# Patient Record
Sex: Female | Born: 1980 | Race: White | Hispanic: No | Smoking: Former smoker
Health system: Southern US, Community
[De-identification: ages and names within clinical notes are randomized; demographics above are authoritative.]

## PROBLEM LIST (undated history)

## (undated) DIAGNOSIS — F329 Major depressive disorder, single episode, unspecified: Secondary | ICD-10-CM

## (undated) DIAGNOSIS — F419 Anxiety disorder, unspecified: Secondary | ICD-10-CM

## (undated) DIAGNOSIS — G43909 Migraine, unspecified, not intractable, without status migrainosus: Secondary | ICD-10-CM

## (undated) DIAGNOSIS — F32A Depression, unspecified: Secondary | ICD-10-CM

## (undated) DIAGNOSIS — E119 Type 2 diabetes mellitus without complications: Secondary | ICD-10-CM

## (undated) DIAGNOSIS — E786 Lipoprotein deficiency: Secondary | ICD-10-CM

## (undated) HISTORY — PX: EYE SURGERY: SHX253

---
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
## 2019-06-07 ENCOUNTER — Emergency Department (HOSPITAL_COMMUNITY)
Admission: EM | Admit: 2019-06-07 | Discharge: 2019-06-07 | Disposition: A | Discharge status: Home / Self Care | Payer: BLUE CROSS/BLUE SHIELD | Attending: Emergency Medicine | Admitting: Emergency Medicine

## 2019-06-07 ENCOUNTER — Encounter (HOSPITAL_COMMUNITY): Payer: Self-pay | Admitting: Emergency Medicine

## 2019-06-07 ENCOUNTER — Other Ambulatory Visit: Payer: Self-pay

## 2019-06-07 DIAGNOSIS — R438 Other disturbances of smell and taste: Secondary | ICD-10-CM | POA: Insufficient documentation

## 2019-06-07 DIAGNOSIS — R1084 Generalized abdominal pain: Secondary | ICD-10-CM | POA: Insufficient documentation

## 2019-06-07 DIAGNOSIS — Z87891 Personal history of nicotine dependence: Secondary | ICD-10-CM | POA: Insufficient documentation

## 2019-06-07 DIAGNOSIS — E119 Type 2 diabetes mellitus without complications: Secondary | ICD-10-CM | POA: Diagnosis not present

## 2019-06-07 DIAGNOSIS — Z20822 Contact with and (suspected) exposure to covid-19: Secondary | ICD-10-CM

## 2019-06-07 DIAGNOSIS — R51 Headache: Secondary | ICD-10-CM | POA: Diagnosis present

## 2019-06-07 DIAGNOSIS — Z20828 Contact with and (suspected) exposure to other viral communicable diseases: Secondary | ICD-10-CM | POA: Diagnosis not present

## 2019-06-07 DIAGNOSIS — M7918 Myalgia, other site: Secondary | ICD-10-CM | POA: Diagnosis not present

## 2019-06-07 DIAGNOSIS — G43909 Migraine, unspecified, not intractable, without status migrainosus: Secondary | ICD-10-CM | POA: Insufficient documentation

## 2019-06-07 HISTORY — DX: Migraine, unspecified, not intractable, without status migrainosus: G43.909

## 2019-06-07 HISTORY — DX: Type 2 diabetes mellitus without complications: E11.9

## 2019-06-07 HISTORY — DX: Anxiety disorder, unspecified: F41.9

## 2019-06-07 HISTORY — DX: Lipoprotein deficiency: E78.6

## 2019-06-07 HISTORY — DX: Depression, unspecified: F32.A

## 2019-06-07 LAB — CBC
HCT: 48.6 % — ABNORMAL HIGH (ref 36.0–46.0)
Hemoglobin: 16.4 g/dL — ABNORMAL HIGH (ref 12.0–15.0)
MCH: 30 pg (ref 26.0–34.0)
MCHC: 33.7 g/dL (ref 30.0–36.0)
MCV: 89 fL (ref 80.0–100.0)
Platelets: 251 10*3/uL (ref 150–400)
RBC: 5.46 MIL/uL — ABNORMAL HIGH (ref 3.87–5.11)
RDW: 13.2 % (ref 11.5–15.5)
WBC: 6 10*3/uL (ref 4.0–10.5)
nRBC: 0 % (ref 0.0–0.2)

## 2019-06-07 LAB — URINALYSIS, ROUTINE W REFLEX MICROSCOPIC
Bilirubin Urine: NEGATIVE
Glucose, UA: >=500 mg/dL — AB
Hgb urine dipstick: NEGATIVE
Ketones, ur: 80 mg/dL — AB
Leukocytes,Ua: NEGATIVE
Nitrite: NEGATIVE
Protein, ur: 100 mg/dL — AB
Specific Gravity, Urine: 1.024 (ref 1.005–1.030)
pH: 6 (ref 5.0–8.0)

## 2019-06-07 LAB — COMPREHENSIVE METABOLIC PANEL
ALT: 22 U/L (ref 0–44)
AST: 23 U/L (ref 15–41)
Albumin: 3.4 g/dL — ABNORMAL LOW (ref 3.5–5.0)
Alkaline Phosphatase: 63 U/L (ref 38–126)
Anion gap: 15 (ref 5–15)
BUN: 13 mg/dL (ref 6–20)
CO2: 16 mmol/L — ABNORMAL LOW (ref 22–32)
Calcium: 9.2 mg/dL (ref 8.9–10.3)
Chloride: 101 mmol/L (ref 98–111)
Creatinine, Ser: 0.73 mg/dL (ref 0.44–1.00)
GFR calc Af Amer: >60 mL/min (ref >60)
GFR calc non Af Amer: >60 mL/min (ref >60)
Glucose, Bld: 272 mg/dL — ABNORMAL HIGH (ref 70–99)
Potassium: 3.4 mmol/L — ABNORMAL LOW (ref 3.5–5.1)
Sodium: 132 mmol/L — ABNORMAL LOW (ref 135–145)
Total Bilirubin: 0.6 mg/dL (ref 0.3–1.2)
Total Protein: 7.5 g/dL (ref 6.5–8.1)

## 2019-06-07 LAB — LIPASE, BLOOD: Lipase: 30 U/L (ref 11–51)

## 2019-06-07 LAB — I-STAT BETA HCG BLOOD, ED (MC, WL, AP ONLY): I-stat hCG, quantitative: <5 m[IU]/mL (ref <5)

## 2019-06-07 MED ORDER — METOCLOPRAMIDE HCL 5 MG/ML IJ SOLN
10.0000 mg | Freq: Once | INTRAMUSCULAR | Status: AC
  Administered 2019-06-07: 18:00:00 10 mg via INTRAVENOUS
  Filled 2019-06-07: qty 2

## 2019-06-07 MED ORDER — KETOROLAC TROMETHAMINE 30 MG/ML IJ SOLN
30.0000 mg | Freq: Once | INTRAMUSCULAR | Status: AC
  Administered 2019-06-07: 30 mg via INTRAVENOUS
  Filled 2019-06-07: qty 1

## 2019-06-07 MED ORDER — SODIUM CHLORIDE 0.9 % IV BOLUS
1000.0000 mL | Freq: Once | INTRAVENOUS | Status: AC
  Administered 2019-06-07: 1000 mL via INTRAVENOUS

## 2019-06-07 MED ORDER — SODIUM CHLORIDE 0.9% FLUSH
3.0000 mL | Freq: Once | INTRAVENOUS | Status: AC
  Administered 2019-06-07: 18:00:00 3 mL via INTRAVENOUS

## 2019-06-07 MED ORDER — DIPHENHYDRAMINE HCL 50 MG/ML IJ SOLN
25.0000 mg | Freq: Once | INTRAMUSCULAR | Status: AC
  Administered 2019-06-07: 25 mg via INTRAVENOUS
  Filled 2019-06-07: qty 1

## 2019-06-07 NOTE — Discharge Instructions (Signed)
As we discussed, you have a cover test pending.  You should self quarantine and isolate until this result comes back.  If it is positive, she continues self quarantine.  Make sure you are getting plenty of fluids and staying hydrated.  You can take Tylenol or Ibuprofen as directed for pain. You can alternate Tylenol and Ibuprofen every 4 hours. If you take Tylenol at 1pm, then you can take Ibuprofen at 5pm. Then you can take Tylenol again at 9pm.   Return the emergency department for any difficulty breathing, inability eat or drink or any other worsening concerning symptoms.     Person Under Monitoring Name: Stephanie Oconnor  Location: [address] [place] [address] [place] [id number]   Infection Prevention Recommendations for Individuals Confirmed to have, or Being Evaluated for, 2019 Novel Coronavirus (COVID-19) Infection Who Receive Care at Home  Individuals who are confirmed to have, or are being evaluated for, COVID-19 should follow the prevention steps below until a healthcare provider or local or state health department says they can return to normal activities.  Stay home except to get medical care You should restrict activities outside your home, except for getting medical care. Do not go to work, school, or public areas, and do not use public transportation or taxis.  Call ahead before visiting your doctor Before your medical appointment, call the healthcare provider and tell them that you have, or are being evaluated for, COVID-19 infection. This will help the healthcare providers office take steps to keep other people from getting infected. Ask your healthcare provider to call the local or state health department.  Monitor your symptoms Seek prompt medical attention if your illness is worsening (e.g., difficulty breathing). Before going to your medical appointment, call the healthcare provider and tell them that you have, or are being evaluated for, COVID-19 infection.  Ask your healthcare provider to call the local or state health department.  Wear a facemask You should wear a facemask that covers your nose and mouth when you are in the same room with other people and when you visit a healthcare provider. People who live with or visit you should also wear a facemask while they are in the same room with you.  Separate yourself from other people in your home As much as possible, you should stay in a different room from other people in your home. Also, you should use a separate bathroom, if available.  Avoid sharing household items You should not share dishes, drinking glasses, cups, eating utensils, towels, bedding, or other items with other people in your home. After using these items, you should wash them thoroughly with soap and water.  Cover your coughs and sneezes Cover your mouth and nose with a tissue when you cough or sneeze, or you can cough or sneeze into your sleeve. Throw used tissues in a lined trash can, and immediately wash your hands with soap and water for at least 20 seconds or use an alcohol-based hand rub.  Wash your [employer]hands Wash your hands often and thoroughly with soap and water for at least 20 seconds. You can use an alcohol-based hand sanitizer if soap and water are not available and if your hands are not visibly dirty. Avoid touching your eyes, nose, and mouth with unwashed hands.   Prevention Steps for Caregivers and Household Members of Individuals Confirmed to have, or Being Evaluated for, COVID-19 Infection Being Cared for in the Home  If you live with, or provide care at home for,  a person confirmed to have, or being evaluated for, COVID-19 infection please follow these guidelines to prevent infection:  Follow healthcare providers instructions Make sure that you understand and can help the patient follow any healthcare provider instructions for all care.  Provide for the patients basic needs You should help the  patient with basic needs in the home and provide support for getting groceries, prescriptions, and other personal needs.  Monitor the patients symptoms If they are getting sicker, call his or her medical provider and tell them that the patient has, or is being evaluated for, COVID-19 infection. This will help the healthcare providers office take steps to keep other people from getting infected. Ask the healthcare provider to call the local or state health department.  Limit the number of people who have contact with the patient  If possible, have only one caregiver for the patient.  Other household members should stay in another home or place of residence. If this is not possible, they should stay  in another room, or be separated from the patient as much as possible. Use a separate bathroom, if available.  Restrict visitors who do not have an essential need to be in the home.  Keep older adults, very young children, and other sick people away from the patient Keep older adults, very young children, and those who have compromised immune systems or chronic health conditions away from the patient. This includes people with chronic heart, lung, or kidney conditions, diabetes, and cancer.  Ensure good ventilation Make sure that shared spaces in the home have good air flow, such as from an air conditioner or an opened window, weather permitting.  Wash your hands often  Wash your hands often and thoroughly with soap and water for at least 20 seconds. You can use an alcohol based hand sanitizer if soap and water are not available and if your hands are not visibly dirty.  Avoid touching your eyes, nose, and mouth with unwashed hands.  Use disposable paper towels to dry your hands. If not available, use dedicated cloth towels and replace them when they become wet.  Wear a facemask and gloves  Wear a disposable facemask at all times in the room and gloves when you touch or have contact  with the patients blood, body fluids, and/or secretions or excretions, such as sweat, saliva, sputum, nasal mucus, vomit, urine, or feces.  Ensure the mask fits over your nose and mouth tightly, and do not touch it during use.  Throw out disposable facemasks and gloves after using them. Do not reuse.  Wash your hands immediately after removing your facemask and gloves.  If your personal clothing becomes contaminated, carefully remove clothing and launder. Wash your hands after handling contaminated clothing.  Place all used disposable facemasks, gloves, and other waste in a lined container before disposing them with other household waste.  Remove gloves and wash your hands immediately after handling these items.  Do not share dishes, glasses, or other household items with the patient  Avoid sharing household items. You should not share dishes, drinking glasses, cups, eating utensils, towels, bedding, or other items with a patient who is confirmed to have, or being evaluated for, COVID-19 infection.  After the person uses these items, you should wash them thoroughly with soap and water.  Wash laundry thoroughly  Immediately remove and wash clothes or bedding that have blood, body fluids, and/or secretions or excretions, such as sweat, saliva, sputum, nasal mucus, vomit, urine, or feces, on them.  Wear gloves when handling laundry from the patient.  Read and follow directions on labels of laundry or clothing items and detergent. In general, wash and dry with the warmest temperatures recommended on the label.  Clean all areas the individual has used often  Clean all touchable surfaces, such as counters, tabletops, doorknobs, bathroom fixtures, toilets, phones, keyboards, tablets, and bedside tables, every day. Also, clean any surfaces that may have blood, body fluids, and/or secretions or excretions on them.  Wear gloves when cleaning surfaces the patient has come in contact with.  Use  a diluted bleach solution (e.g., dilute bleach with 1 part bleach and 10 parts water) or a household disinfectant with a label that says EPA-registered for coronaviruses. To make a bleach solution at home, add 1 tablespoon of bleach to 1 quart (4 cups) of water. For a larger supply, add  cup of bleach to 1 gallon (16 cups) of water.  Read labels of cleaning products and follow recommendations provided on product labels. Labels contain instructions for safe and effective use of the cleaning product including precautions you should take when applying the product, such as wearing gloves or eye protection and making sure you have good ventilation during use of the product.  Remove gloves and wash hands immediately after cleaning.  Monitor yourself for signs and symptoms of illness Caregivers and household members are considered close contacts, should monitor their health, and will be asked to limit movement outside of the home to the extent possible. Follow the monitoring steps for close contacts listed on the symptom monitoring form.   ? If you have additional questions, contact your local health department or call the epidemiologist on call at [phone] (available 24/7). ? This guidance is subject to change. For the most up-to-date guidance from [facility], please refer to their website: TripMetro.huhttps://www.cdc.gov/coronavirus/2019-ncov/hcp/guidance-prevent-spread.html

## 2019-06-07 NOTE — ED Triage Notes (Signed)
Pt with reports of migraine, diarrhea since Saturday night. Using OTC meds and Butolatol without relief. Yesterday she noticed her taste and smell has decreased. A/O x4 States she has been tested x2 for Covid-19 with negative results.

## 2019-06-07 NOTE — ED Notes (Signed)
Urine culture send with sample 

## 2019-06-07 NOTE — ED Provider Notes (Signed)
[facility] Note   CSN: [id number] Arrival date & time: 06/07/19  1343    History   Chief Complaint Chief Complaint  Patient presents with  . Migraine  . Diarrhea  . Loss of Taste and Smell    HPI Stephanie SewerBarbara Oconnor is a 38 y.o. female past medical history of anxiety, depression, diabetes, migraines who presents for evaluation of headache, generalized body aches, generalized abdominal pain and diarrhea.  She reports that her headache initially began about 4 days ago.  At onset of symptoms, is very mild and states that it has progressively worsened.  She does have a history of migraines but states that this headache feels similar in nature to her previous headaches.  She took her home medication (Butolatol) with no improvement in headache.  Denies any preceding trauma, head injury, fall.  He has not had any vision changes, numbness/weakness of her arms or legs, vomiting.  She has had some nausea which is typical of her migraines.  She has not noted any fever.  Patient states that she also started having some generalized body aches about 3 days ago as well some generalized abdominal pain.  She also reports she has had multiple episodes of nonbloody diarrhea over the last 3 days.  She reports decreased appetite.  She reports that yesterday, she felt like she lost her taste and smell.  She has not noted any fevers, cough, chest pain, difficulty breathing, vomiting.  She has not had any recent travel outside the country, travel inside the country, recent antibiotics.  She denies any known, 19 exposure.     The history is provided by the patient.    Past Medical History:  Diagnosis Date  . Anxiety   . Depression   . Diabetes mellitus without complication (HCC)   . Hypocholesteremia   . Migraines     There are no active problems to display for this patient.   Past Surgical History:  Procedure Laterality Date  . EYE SURGERY       OB History    No obstetric history on file.      Home Medications    Prior to Admission medications   Not on File    Family History No family history on file.  Social History Social History   Tobacco Use  . Smoking status: Former Games developermoker  . Smokeless tobacco: Never Used  Substance Use Topics  . Alcohol use: Yes  . Drug use: Yes    Types: Marijuana     Allergies   Codeine   Review of Systems Review of Systems  Constitutional: Positive for appetite change. Negative for fever.  Eyes: Negative for visual disturbance.  Respiratory: Negative for cough and shortness of breath.   Cardiovascular: Negative for chest pain.  Gastrointestinal: Positive for abdominal pain, diarrhea and nausea. Negative for blood in stool and vomiting.  Genitourinary: Negative for dysuria and hematuria.  Musculoskeletal: Positive for myalgias.  Neurological: Positive for headaches. Negative for weakness and numbness.  All other systems reviewed and are negative.    Physical Exam Updated Vital Signs BP 110/85   Pulse 95   Temp 98.4 F (36.9 C) (Oral)   Resp 17   LMP 05/22/2019   SpO2 99%   Physical Exam Vitals signs and nursing note reviewed.  Constitutional:      Appearance: Normal appearance. She is well-developed.  HENT:     Head: Normocephalic and atraumatic.  Eyes:     General: Lids are  normal.     Conjunctiva/sclera: Conjunctivae normal.     Pupils: Pupils are equal, round, and reactive to light.     Comments: PERRL. EOMs intact. No nystagmus. No neglect.   Neck:     Musculoskeletal: Full passive range of motion without pain and neck supple.     Comments: Neck is supple and without rigidity. Cardiovascular:     Rate and Rhythm: Normal rate and regular rhythm.     Pulses: Normal pulses.     Heart sounds: Normal heart sounds. No murmur. No friction rub. No gallop.   Pulmonary:     Effort: Pulmonary effort is normal.     Breath sounds: Normal breath sounds.  Abdominal:      Palpations: Abdomen is soft. Abdomen is not rigid.     Tenderness: There is generalized abdominal tenderness. There is no guarding.     Comments: Abdomen is soft, nondistended.  Diffuse tenderness noted throughout with no focal point.  No rigidity, guarding.  Musculoskeletal: Normal range of motion.  Skin:    General: Skin is warm and dry.     Capillary Refill: Capillary refill takes less than 2 seconds.  Neurological:     Mental Status: She is alert and oriented to person, place, and time.     Comments: Cranial nerves III-XII intact Follows commands, Moves all extremities  5/5 strength to BUE and BLE  Sensation intact throughout all major nerve distributions Normal coordination  No slurred speech. No facial droop.   Psychiatric:        Speech: Speech normal.      ED Treatments / Results  Labs (all labs ordered are listed, but only abnormal results are displayed) Labs Reviewed  COMPREHENSIVE METABOLIC PANEL - Abnormal; Notable for the following components:      Result Value   Sodium 132 (*)    Potassium 3.4 (*)    CO2 16 (*)    Glucose, Bld 272 (*)    Albumin 3.4 (*)    All other components within normal limits  CBC - Abnormal; Notable for the following components:   RBC 5.46 (*)    Hemoglobin 16.4 (*)    HCT 48.6 (*)    All other components within normal limits  URINALYSIS, ROUTINE W REFLEX MICROSCOPIC - Abnormal; Notable for the following components:   Color, Urine AMBER (*)    APPearance CLOUDY (*)    Glucose, UA >=500 (*)    Ketones, ur 80 (*)    Protein, ur 100 (*)    Bacteria, UA MANY (*)    All other components within normal limits  SARS CORONAVIRUS 2  LIPASE, BLOOD  I-STAT BETA HCG BLOOD, ED ([facility], [facility], [facility] ONLY)    EKG None  Radiology No results found.  Procedures Procedures (including critical care time)  Medications Ordered in ED Medications  sodium chloride flush (NS) 0.9 % injection 3 mL (3 mLs Intravenous Given 06/07/19 1753)  sodium chloride  0.9 % bolus 1,000 mL (0 mLs Intravenous Stopped 06/07/19 1927)  metoCLOPramide (REGLAN) injection 10 mg (10 mg Intravenous Given 06/07/19 1755)  diphenhydrAMINE (BENADRYL) injection 25 mg (25 mg Intravenous Given 06/07/19 1754)  ketorolac (TORADOL) 30 MG/ML injection 30 mg (30 mg Intravenous Given 06/07/19 1757)     Initial Impression / Assessment and Plan / ED Course  I have reviewed the triage vital signs and the nursing notes.  Pertinent labs & imaging results that were available during my care of the patient were reviewed by me  and considered in my medical decision making (see chart for details).        38 year old female with past medical 3 of diabetes who presents for evaluation of generalized abdominal pain, diarrhea, decreased appetite, myalgias, headache.  History of migraines and states that headache feels the same.  No fevers, chest pain, difficulty breathing.  No known COVID-19 exposure. Patient is afebrile, non-toxic appearing, sitting comfortably on examination table. Vital signs reviewed and stable.  No neuro deficits on exam.  Neck is supple and without rigidity.  Diffuse tenderness noted to the abdomen without any focal point.  Consider migraine headache.  History/physical exam not concerning for meningitis.  Consider abdominal infectious process versus viral GI process.  Also consider COVID-19.  We will plan for COVID-19 testing, migraine cocktail.  Labs ordered at triage.  I-STAT beta negative.  CMP shows potassium of 3.4.  Glucose is 272.  Urine creatinine within normal limits.  Work-up not concerning for DKA.  Lipase is unremarkable.  CBC unremarkable.  Urine shows small amount of ketones.  Suspect this is most likely from dehydration.  No evidence of infectious etiology.  There is some bacteria noted but suspect this is contaminant from squamous epithelium.  Reevaluation.  Patient reports feeling better.  Headache has improved.  Patient is ready to go home.  I discussed with  patient that COVID results are pending and that she should quarantine and self isolate and see her back.  Encouraged supportive care measures. At this time, patient exhibits no emergent life-threatening condition that require further evaluation in ED or admission. Patient had ample opportunity for questions and discussion. All patient's questions were answered with full understanding. Strict return precautions discussed. Patient expresses understanding and agreement to plan.   Stephanie SewerBarbara Oconnor was evaluated in Emergency Department on 06/07/2019 for the symptoms described in the history of present illness. She was evaluated in the context of the global COVID-19 pandemic, which necessitated consideration that the patient might be at risk for infection with the SARS-CoV-2 virus that causes COVID-19. Institutional protocols and algorithms that pertain to the evaluation of patients at risk for COVID-19 are in a state of rapid change based on information released by regulatory bodies including the CDC and federal and state organizations. These policies and algorithms were followed during the patient's care in the ED.  Portions of this note were generated with Scientist, clinical (histocompatibility and immunogenetics)Dragon dictation software. Dictation errors may occur despite best attempts at proofreading.   Final Clinical Impressions(s) / ED Diagnoses   Final diagnoses:  Migraine without status migrainosus, not intractable, unspecified migraine type  Suspected Covid-19 Virus Infection    ED Discharge Orders    None       Rosana HoesLayden, Kagan Mutchler A, PA-C 06/07/19 2244    Maia PlanLong, Joshua G, MD 06/08/19 0930

## 2019-06-07 NOTE — ED Notes (Signed)
Discharge instructions discussed with pt. Pt verbalized understanding no questions at this time.  

## 2019-06-08 LAB — SARS CORONAVIRUS 2 (TAT 6-24 HRS): SARS Coronavirus 2: NEGATIVE

## 2019-08-22 ENCOUNTER — Emergency Department (HOSPITAL_COMMUNITY)
Admission: EM | Admit: 2019-08-22 | Discharge: 2019-08-23 | Disposition: A | Discharge status: Home / Self Care | Payer: BLUE CROSS/BLUE SHIELD | Attending: Emergency Medicine | Admitting: Emergency Medicine

## 2019-08-22 ENCOUNTER — Other Ambulatory Visit: Payer: Self-pay

## 2019-08-22 ENCOUNTER — Encounter (HOSPITAL_COMMUNITY): Payer: Self-pay | Admitting: Emergency Medicine

## 2019-08-22 DIAGNOSIS — Z87891 Personal history of nicotine dependence: Secondary | ICD-10-CM | POA: Diagnosis not present

## 2019-08-22 DIAGNOSIS — F1210 Cannabis abuse, uncomplicated: Secondary | ICD-10-CM | POA: Diagnosis not present

## 2019-08-22 DIAGNOSIS — K047 Periapical abscess without sinus: Secondary | ICD-10-CM

## 2019-08-22 DIAGNOSIS — E119 Type 2 diabetes mellitus without complications: Secondary | ICD-10-CM | POA: Insufficient documentation

## 2019-08-22 DIAGNOSIS — K0889 Other specified disorders of teeth and supporting structures: Secondary | ICD-10-CM | POA: Diagnosis present

## 2019-08-22 MED ORDER — BUPIVACAINE-EPINEPHRINE (PF) 0.5% -1:200000 IJ SOLN
1.8000 mL | Freq: Once | INTRAMUSCULAR | Status: AC
  Administered 2019-08-23: 1.8 mL
  Filled 2019-08-22: qty 1.8

## 2019-08-22 NOTE — ED Triage Notes (Addendum)
Pt here for eval of right jaw pain/dental pain with swelling to right jaw for several days. It started out as a broken tooth.

## 2019-08-22 NOTE — ED Provider Notes (Signed)
[facility] EMERGENCY DEPARTMENT Provider Note   CSN: [id number] Arrival date & time: 08/22/19  1657     History   Chief Complaint Chief Complaint  Patient presents with  . Dental Pain  . Oral Swelling    HPI Stephanie Oconnor is a 38 y.o. female with a history of diabetes mellitus who presented to the emergency department with a chief complaint of right-sided jaw pain and swelling for the last 2 days.  She reports that she has a broken tooth on the right lower side that has been present for several years.  She reports that when the pain intensifies but will shoot down her neck, but she denies fever, chills, drooling, muffled voice, sore throat, URI symptoms.  She recently moved to the area from [place] and is not established with a dentist.  She has been taking ibuprofen for pain with minimal improvement.  She is a diabetic and reports that her blood sugars have been slightly elevated, around 180s at home, since the onset of her symptoms.  No missed doses of her home medications.     The history is provided by the patient. No language interpreter was used.    Past Medical History:  Diagnosis Date  . Anxiety   . Depression   . Diabetes mellitus without complication (HCC)   . Hypocholesteremia   . Migraines     There are no active problems to display for this patient.   Past Surgical History:  Procedure Laterality Date  . EYE SURGERY       OB History   No obstetric history on file.      Home Medications    Prior to Admission medications   Medication Sig Start Date End Date Taking? Authorizing Provider  amoxicillin (AMOXIL) 500 MG capsule Take 1 capsule (500 mg total) by mouth 3 (three) times daily for 5 days. 08/23/19 08/28/19  Aristide Waggle A, PA-C  HYDROcodone-acetaminophen (HYCET) 7.5-325 mg/15 ml solution Take 10 mLs by mouth every 6 (six) hours as needed for moderate pain. 08/23/19   Viktorya Arguijo A, PA-C    Family History No family history  on file.  Social History Social History   Tobacco Use  . Smoking status: Former Games developer  . Smokeless tobacco: Never Used  Substance Use Topics  . Alcohol use: Yes  . Drug use: Yes    Types: Marijuana     Allergies   Codeine   Review of Systems Review of Systems  Constitutional: Negative for activity change, chills and fever.  HENT: Positive for dental problem and facial swelling. Negative for drooling, ear discharge, ear pain, mouth sores, sore throat, trouble swallowing and voice change.   Respiratory: Negative for shortness of breath and wheezing.   Cardiovascular: Negative for chest pain, palpitations and leg swelling.  Gastrointestinal: Negative for abdominal pain, constipation, diarrhea and nausea.  Genitourinary: Negative for dysuria.  Musculoskeletal: Negative for back pain.  Skin: Negative for rash.  Allergic/Immunologic: Negative for immunocompromised state.  Neurological: Negative for headaches.  Psychiatric/Behavioral: Negative for confusion.    Physical Exam Updated Vital Signs BP (!) 151/100 (BP Location: Right Arm)   Pulse 100   Temp 98.2 F (36.8 C) (Oral)   Resp (!) 24   LMP 08/01/2019   SpO2 100%   Physical Exam Vitals signs and nursing note reviewed.  Constitutional:      General: She is not in acute distress.    Appearance: She is not ill-appearing, toxic-appearing or diaphoretic.  Comments: Tearful  HENT:     Head: Normocephalic.     Mouth/Throat:     Dentition: Dental abscesses present.     Pharynx: Oropharynx is clear. Uvula midline. No pharyngeal swelling, oropharyngeal exudate, posterior oropharyngeal erythema or uvula swelling.     Tonsils: 0 on the right. 0 on the left.      Comments: Right lower first molar is fractured to the level of the gumline on the buccal aspect of the tooth however, the tooth is intact on the lingual aspect.  A small amount of fluctuance is noted to the lower buccal mucosa consistent with periapical abscess.   Minimal purulent drainage was expressed with palpation of the distal aspect of tooth #30. Mild fluctuance is noted along the body of the right mandible.  No sublingual edema.  Uvula is midline with clear posterior oropharynx.  There is no trismus.  No tonsillar swelling or exudates. Eyes:     Conjunctiva/sclera: Conjunctivae normal.  Neck:     Musculoskeletal: Neck supple. No neck rigidity.     Comments: No meningismus Cardiovascular:     Rate and Rhythm: Normal rate and regular rhythm.     Heart sounds: No murmur. No friction rub. No gallop.   Pulmonary:     Effort: Pulmonary effort is normal. No respiratory distress.     Comments: Tolerating secretions without difficulty. Abdominal:     General: There is no distension.     Palpations: Abdomen is soft.  Lymphadenopathy:     Cervical: Cervical adenopathy present.  Skin:    General: Skin is warm.     Findings: No rash.  Neurological:     Mental Status: She is alert.  Psychiatric:        Behavior: Behavior normal.      ED Treatments / Results  Labs (all labs ordered are listed, but only abnormal results are displayed) Labs Reviewed - No data to display  EKG None  Radiology No results found.  Procedures .Marland Kitchen.Incision and Drainage  Date/Time: 08/23/2019 12:21 AM Performed by: Barkley BoardsMcDonald, Myrta Mercer A, PA-C Authorized by: Barkley BoardsMcDonald, Hannan Hutmacher A, PA-C   Consent:    Consent obtained:  Verbal   Consent given by:  Patient   Risks discussed:  Bleeding, incomplete drainage, pain, infection and damage to other organs   Alternatives discussed:  Observation Location:    Type:  Abscess   Location:  Mouth   Mouth location: periapical abscess of tooth #30. Anesthesia (see MAR for exact dosages):    Anesthesia method:  Topical application and local infiltration   Topical anesthetic:  Lidocaine gel   Local anesthetic:  Bupivacaine 0.5% WITH epi Procedure type:    Complexity:  Simple Procedure details:    Needle aspiration: no     Incision  types:  Single straight   Incision depth:  Dermal   Scalpel blade:  10   Drainage:  Bloody   Drainage amount:  Scant   Wound treatment:  Wound left open   Packing materials:  None Post-procedure details:    Patient tolerance of procedure:  Tolerated well, no immediate complications   (including critical care time)  Medications Ordered in ED Medications  bupivacaine-epinephrine (MARCAINE W/ EPI) 0.5% -1:200000 injection 1.8 mL (has no administration in time range)  amoxicillin (AMOXIL) capsule 500 mg (has no administration in time range)     Initial Impression / Assessment and Plan / ED Course  I have reviewed the triage vital signs and the nursing notes.  Pertinent labs &  imaging results that were available during my care of the patient were reviewed by me and considered in my medical decision making (see chart for details).        38 year old female presenting with right-sided facial swelling for the last 2 days.  On exam, tooth #30 is fractured to the level of the gumline on the buccal aspect of the tooth.  Minimal purulent drainage is expressed with palpation of the distal aspect of the tube.  Uvula is midline.  She is tolerating secretions without difficulty.  There is no trismus.  No sublingual edema.  Doubt PTA, retropharyngeal abscess, deep space infection of the neck, or Ludwig's angina.  Shared decision-making conversation.  She would like to proceed with a dental block.  Prior to procedure, we discussed the risks versus benefit of the procedure, including bleeding, incomplete drainage, pain, damage to other organs, or infection.  He agreed to proceed with the procedure.  Inferior alveolar block was performed on the right.  Anesthesia was achieved.  Following the procedure, aside from anesthesia associated with the right inferior alveolar nerve, she was neurovascularly intact with no complaints.  A 0.5 cm incision was made to the buccal gingiva at the base of the tooth #30.  A  scant amount of bloody discharge draining from the area.  I attempted to express additional purulent drainage from the distal aspect of tooth 30 without success.  Given known source of infection and the patient's history of diabetes mellitus, first dose of amoxicillin was given in the ER.  I will discharge her home with salt water rinses, amoxicillin, Tylenol and ibuprofen with Hycet for uncontrollable pain with dental follow-up.  All questions answered.  Following procedure, she reports significant improvement in her pain.  She is hemodynamically stable in no acute distress.  ER return precautions given.  Safe to discharge to home with outpatient follow-up as indicated.  Final Clinical Impressions(s) / ED Diagnoses   Final diagnoses:  Dental abscess    ED Discharge Orders         Ordered    amoxicillin (AMOXIL) 500 MG capsule  3 times daily     08/23/19 0019    HYDROcodone-acetaminophen (HYCET) 7.5-325 mg/15 ml solution  Every 6 hours PRN     08/23/19 0019           Kamdyn Colborn, Maree Erie A, PA-C 08/23/19 Josefa Half, MD 08/23/19 [phone]

## 2019-08-23 MED ORDER — AMOXICILLIN 500 MG PO CAPS
500.0000 mg | ORAL_CAPSULE | Freq: Once | ORAL | Status: AC
  Administered 2019-08-23: 500 mg via ORAL
  Filled 2019-08-23: qty 1

## 2019-08-23 MED ORDER — HYDROCODONE-ACETAMINOPHEN 7.5-325 MG/15ML PO SOLN: 10.0000 mL | Freq: Four times a day (QID) | ORAL | 0 refills | Status: DC | PRN

## 2019-08-23 MED ORDER — AMOXICILLIN 500 MG PO CAPS: 500.0000 mg | ORAL_CAPSULE | Freq: Three times a day (TID) | ORAL | 0 refills | Status: AC

## 2019-08-23 NOTE — Discharge Instructions (Addendum)
Thank you for allowing me to care for you today in the Emergency Department.   Call Dr. Jackson Latino office tomorrow morning to schedule follow-up appointment.  I have also attached a dental resource guide for the [facility] area to get established with a dentist.  Your first dose of amoxicillin, an antibiotic, was given tonight in the ER.  Take 1 tablet by mouth every 8 hours for the next 5 days to prevent infection.  Make sure that you are gargling warm salt water every 6 hours to prevent infection.  Take 650 mg of Tylenol or 600 mg of ibuprofen with food every 6 hours for pain.  You can alternate between these 2 medications every 3 hours if your pain returns.  For instance, you can take Tylenol at noon, followed by a dose of ibuprofen at 3, followed by second dose of Tylenol and 6.  For severe, uncontrollable pain, you can take a 10 mL of Hycet every 6 hours.  Do not work or drive while taking this medication because it can cause you be impaired.  Do not take this medication with other sedating substances, such as alcohol because it can cause you to be impaired.  It is narcotic and can be addicting.  Return the emergency department if you develop high fevers, if you become unable to swallow, has severe drooling from the mouth because you are unable to swallow, develop severe swelling to the side of your neck, or other new, concerning symptoms.

## 2019-08-26 ENCOUNTER — Emergency Department (HOSPITAL_COMMUNITY)
Admission: EM | Admit: 2019-08-26 | Discharge: 2019-08-26 | Disposition: A | Discharge status: Home / Self Care | Payer: BLUE CROSS/BLUE SHIELD | Attending: Emergency Medicine | Admitting: Emergency Medicine

## 2019-08-26 ENCOUNTER — Other Ambulatory Visit: Payer: Self-pay

## 2019-08-26 ENCOUNTER — Emergency Department (HOSPITAL_COMMUNITY): Payer: BLUE CROSS/BLUE SHIELD

## 2019-08-26 DIAGNOSIS — Z87891 Personal history of nicotine dependence: Secondary | ICD-10-CM | POA: Insufficient documentation

## 2019-08-26 DIAGNOSIS — K0889 Other specified disorders of teeth and supporting structures: Secondary | ICD-10-CM | POA: Diagnosis not present

## 2019-08-26 DIAGNOSIS — Z794 Long term (current) use of insulin: Secondary | ICD-10-CM | POA: Insufficient documentation

## 2019-08-26 DIAGNOSIS — E119 Type 2 diabetes mellitus without complications: Secondary | ICD-10-CM | POA: Insufficient documentation

## 2019-08-26 LAB — CBC WITH DIFFERENTIAL/PLATELET
Abs Immature Granulocytes: 0.03 10*3/uL (ref 0.00–0.07)
Basophils Absolute: 0.1 10*3/uL (ref 0.0–0.1)
Basophils Relative: 1 %
Eosinophils Absolute: 0.1 10*3/uL (ref 0.0–0.5)
Eosinophils Relative: 1 %
HCT: 45.5 % (ref 36.0–46.0)
Hemoglobin: 15.5 g/dL — ABNORMAL HIGH (ref 12.0–15.0)
Immature Granulocytes: 0 %
Lymphocytes Relative: 22 %
Lymphs Abs: 1.7 10*3/uL (ref 0.7–4.0)
MCH: 30.2 pg (ref 26.0–34.0)
MCHC: 34.1 g/dL (ref 30.0–36.0)
MCV: 88.5 fL (ref 80.0–100.0)
Monocytes Absolute: 0.4 10*3/uL (ref 0.1–1.0)
Monocytes Relative: 5 %
Neutro Abs: 5.5 10*3/uL (ref 1.7–7.7)
Neutrophils Relative %: 71 %
Platelets: 298 10*3/uL (ref 150–400)
RBC: 5.14 MIL/uL — ABNORMAL HIGH (ref 3.87–5.11)
RDW: 13 % (ref 11.5–15.5)
WBC: 7.8 10*3/uL (ref 4.0–10.5)
nRBC: 0 % (ref 0.0–0.2)

## 2019-08-26 LAB — BASIC METABOLIC PANEL
Anion gap: 8 (ref 5–15)
BUN: 8 mg/dL (ref 6–20)
CO2: 22 mmol/L (ref 22–32)
Calcium: 8.8 mg/dL — ABNORMAL LOW (ref 8.9–10.3)
Chloride: 104 mmol/L (ref 98–111)
Creatinine, Ser: 0.56 mg/dL (ref 0.44–1.00)
GFR calc Af Amer: >60 mL/min (ref >60)
GFR calc non Af Amer: >60 mL/min (ref >60)
Glucose, Bld: 307 mg/dL — ABNORMAL HIGH (ref 70–99)
Potassium: 4.4 mmol/L (ref 3.5–5.1)
Sodium: 134 mmol/L — ABNORMAL LOW (ref 135–145)

## 2019-08-26 LAB — I-STAT BETA HCG BLOOD, ED (MC, WL, AP ONLY): I-stat hCG, quantitative: <5 m[IU]/mL (ref <5)

## 2019-08-26 MED ORDER — IOHEXOL 300 MG/ML  SOLN
75.0000 mL | Freq: Once | INTRAMUSCULAR | Status: AC | PRN
  Administered 2019-08-26: 75 mL via INTRAVENOUS

## 2019-08-26 MED ORDER — NAPROXEN 500 MG PO TABS: 500.0000 mg | ORAL_TABLET | Freq: Two times a day (BID) | ORAL | 0 refills | Status: DC | PRN

## 2019-08-26 MED ORDER — MORPHINE SULFATE (PF) 4 MG/ML IV SOLN
4.0000 mg | Freq: Once | INTRAVENOUS | Status: AC
  Administered 2019-08-26: 4 mg via INTRAVENOUS
  Filled 2019-08-26: qty 1

## 2019-08-26 MED ORDER — CLINDAMYCIN PHOSPHATE 600 MG/50ML IV SOLN
600.0000 mg | Freq: Once | INTRAVENOUS | Status: AC
  Administered 2019-08-26: 600 mg via INTRAVENOUS
  Filled 2019-08-26: qty 50

## 2019-08-26 MED ORDER — HYDROCODONE-ACETAMINOPHEN 5-325 MG PO TABS: 1.0000 | ORAL_TABLET | Freq: Four times a day (QID) | ORAL | 0 refills | Status: DC | PRN

## 2019-08-26 MED ORDER — ONDANSETRON HCL 4 MG/2ML IJ SOLN
4.0000 mg | Freq: Once | INTRAMUSCULAR | Status: AC
  Administered 2019-08-26: 4 mg via INTRAVENOUS
  Filled 2019-08-26: qty 2

## 2019-08-26 MED ORDER — CHLORHEXIDINE GLUCONATE 0.12 % MT SOLN: 15.0000 mL | Freq: Two times a day (BID) | OROMUCOSAL | 0 refills | Status: DC

## 2019-08-26 NOTE — ED Provider Notes (Signed)
[facility] EMERGENCY DEPARTMENT Provider Note   CSN: [id number] Arrival date & time: 08/26/19  1133     History   Chief Complaint Chief Complaint  Patient presents with   Dental Pain    HPI Chinelo Benn is a 38 y.o. female past medical history significant for anxiety, depression, diabetes on insulin, migraines presents to emergency room today with chief complaint of dental pain.  This has been going on for 4 days but progressively worsened yesterday.  Patient states when she was walking in the rain yesterday she slipped and fell hitting the right side of her face on a car mirror.  She landed on her bottom.  Denies LOC.  She describes the pain as sharp and throbbing.  Pain is constant.  Pain radiates to her right ear and right side of her neck.  She rates the pain 9/10 in severity.  She has been taking amoxicillin as prescribed.  Has also been taking Tylenol ibuprofen with transient symptom relief.  She states she been a hard time eating drinking because the pain is so severe.  She is also had episodes of drooling.  She has been checking her blood sugars at home and states they have been in the 180s to 190s.  She denies any fever, chills, headache, voice changes, abdominal pain, nausea, vomiting, chest pain, visual changes, sore throat, nausea, vomiting.  Patient was seen on 08/22/2019 in the ED for right jaw pain and swelling x2 days.  Patient had dental block and incision and drainage to the buccal gingiva of tooth 30.  Patient  was discharged home with prescription for amoxicillin and symptomatic care.   Past Medical History:  Diagnosis Date   Anxiety    Depression    Diabetes mellitus without complication (HCC)    Hypocholesteremia    Migraines     There are no active problems to display for this patient.   Past Surgical History:  Procedure Laterality Date   EYE SURGERY       OB History   No obstetric history on file.      Home Medications     Prior to Admission medications   Medication Sig Start Date End Date Taking? Authorizing Provider  amoxicillin (AMOXIL) 500 MG capsule Take 1 capsule (500 mg total) by mouth 3 (three) times daily for 5 days. 08/23/19 08/28/19  McDonald, Mia A, PA-C  chlorhexidine (PERIDEX) 0.12 % solution Use as directed 15 mLs in the mouth or throat 2 (two) times daily. 08/26/19   Kaimani Clayson E, PA-C  HYDROcodone-acetaminophen (NORCO/VICODIN) 5-325 MG tablet Take 1 tablet by mouth every 6 (six) hours as needed for severe pain. 08/26/19   Turhan Chill E, PA-C  naproxen (NAPROSYN) 500 MG tablet Take 1 tablet (500 mg total) by mouth 2 (two) times daily as needed. 08/26/19   Satia Winger, Caroleen Hamman, PA-C    Family History No family history on file.  Social History Social History   Tobacco Use   Smoking status: Former Smoker   Smokeless tobacco: Never Used  Substance Use Topics   Alcohol use: Yes   Drug use: Yes    Types: Marijuana     Allergies   Codeine   Review of Systems Review of Systems  Constitutional: Negative for chills, diaphoresis and fever.  HENT: Positive for dental problem, drooling, ear pain and facial swelling. Negative for congestion, ear discharge, sinus pressure, sinus pain, sore throat and trouble swallowing.   Eyes: Negative for pain and redness.  Respiratory: Negative for cough and shortness of breath.   Cardiovascular: Negative for chest pain.  Gastrointestinal: Negative for abdominal pain, constipation, diarrhea, nausea and vomiting.  Genitourinary: Negative for dysuria and hematuria.  Musculoskeletal: Positive for neck pain. Negative for back pain and neck stiffness.  Skin: Negative for wound.  Allergic/Immunologic: Positive for immunocompromised state (diabetic).  Neurological: Negative for weakness, numbness and headaches.     Physical Exam Updated Vital Signs BP (!) 153/89    Pulse 97    Temp 98.3 F (36.8 C) (Oral)    Resp 16    Ht 5\' 8"  (1.727 m)     Wt 101.2 kg    LMP 08/01/2019    SpO2 99%    BMI 33.91 kg/m   Physical Exam Vitals signs and nursing note reviewed.  Constitutional:      General: She is not in acute distress.    Appearance: She is not ill-appearing.  HENT:     Head: Normocephalic and atraumatic.     Right Ear: Tympanic membrane and external ear normal.     Left Ear: Tympanic membrane and external ear normal.     Nose: Nose normal.     Mouth/Throat:     Mouth: Mucous membranes are moist.     Pharynx: Oropharynx is clear.     Comments: Right lower first molar is fractured to the level of the gumline on the buccal aspect of the tooth.   Minimal purulent drainage was expressed with palpation of the distal aspect of tooth #30. Mild fluctuance is noted along the body of the right mandible.  No sublingual edema.  Uvula is midline with clear posterior oropharynx.  There is no trismus.  No tonsillar swelling or exudates. Eyes:     General: No scleral icterus.       Right eye: No discharge.        Left eye: No discharge.     Extraocular Movements: Extraocular movements intact.     Conjunctiva/sclera: Conjunctivae normal.     Pupils: Pupils are equal, round, and reactive to light.  Neck:     Musculoskeletal: Normal range of motion.     Vascular: No JVD.  Cardiovascular:     Rate and Rhythm: Normal rate and regular rhythm.     Pulses: Normal pulses.          Radial pulses are 2+ on the right side and 2+ on the left side.     Heart sounds: Normal heart sounds.  Pulmonary:     Comments: Lungs clear to auscultation in all fields. Symmetric chest rise. No wheezing, rales, or rhonchi. Abdominal:     Comments: Abdomen is soft, non-distended, and non-tender in all quadrants. No rigidity, no guarding. No peritoneal signs.  Musculoskeletal: Normal range of motion.  Skin:    General: Skin is warm and dry.     Capillary Refill: Capillary refill takes less than 2 seconds.  Neurological:     Mental Status: She is oriented to  person, place, and time.     GCS: GCS eye subscore is 4. GCS verbal subscore is 5. GCS motor subscore is 6.     Comments: Fluent speech, no facial droop.  Psychiatric:        Mood and Affect: Affect is tearful.        Behavior: Behavior normal.      ED Treatments / Results  Labs (all labs ordered are listed, but only abnormal results are displayed) Labs Reviewed  BASIC METABOLIC  PANEL - Abnormal; Notable for the following components:      Result Value   Sodium 134 (*)    Glucose, Bld 307 (*)    Calcium 8.8 (*)    All other components within normal limits  CBC WITH DIFFERENTIAL/PLATELET - Abnormal; Notable for the following components:   RBC 5.14 (*)    Hemoglobin 15.5 (*)    All other components within normal limits  I-STAT BETA HCG BLOOD, ED ([facility], [facility], [facility] ONLY)    EKG None  Radiology Ct Soft Tissue Neck W Contrast  Result Date: 08/26/2019 CLINICAL DATA:  Mass, lump or swelling. Dental disease. Right jaw pain. EXAM: CT NECK WITH CONTRAST TECHNIQUE: Multidetector CT imaging of the neck was performed using the standard protocol following the bolus administration of intravenous contrast. CONTRAST:  41mL OMNIPAQUE IOHEXOL 300 MG/ML  SOLN COMPARISON:  None. FINDINGS: Pharynx and larynx: No mucosal or submucosal lesion. Salivary glands: Parotid and submandibular glands are normal. Thyroid: Heterogeneous gland consistent with goiter. Lymph nodes: Mildly prominent reactive level 2 lymph nodes on the right. No separated nodes. Vascular: Negative Limited intracranial: Normal Visualized orbits: Normal Mastoids and visualized paranasal sinuses: Clear Skeleton: Extensive dental and periodontal disease. Probable cortical erosion of the lateral mandible at tooth 29 which could be the cause the facial inflammatory disease. Upper chest: Negative Other: Nonspecific soft tissue inflammation of the right side of the face consistent with cellulitis. Phlegmonous change adjacent to the mandible, without  evidence of a drainable abscess. IMPRESSION: Dental and periodontal disease. Probable lateral cortical erosion of the mandible of tooth 29 responsible for the right face inflammation. Findings are consistent with phlegmonous inflammation without evidence of drainable abscess. Electronically Signed   By: Nelson Chimes M.D.   On: 08/26/2019 18:03    Procedures Procedures (including critical care time)  Medications Ordered in ED Medications  morphine 4 MG/ML injection 4 mg (4 mg Intravenous Given 08/26/19 1240)  ondansetron (ZOFRAN) injection 4 mg (4 mg Intravenous Given 08/26/19 1240)  clindamycin (CLEOCIN) IVPB 600 mg (0 mg Intravenous Stopped 08/26/19 1621)  iohexol (OMNIPAQUE) 300 MG/ML solution 75 mL (75 mLs Intravenous Contrast Given 08/26/19 1700)     Initial Impression / Assessment and Plan / ED Course  I have reviewed the triage vital signs and the nursing notes.  Pertinent labs & imaging results that were available during my care of the patient were reviewed by me and considered in my medical decision making (see chart for details).  Patient seen and examined. Patient nontoxic appearing, in no apparent distress.  She is afebrile, is within normal limits.  Patient is tearful on exam.  She appears to be uncomfortable.  She has swelling to the right side of mandible.  Submandibular area is soft and nontender to palpation. Labs today without leukocytosis, severe electrolyte derangement, renal insufficiency, no anemia.  Glucose is elevated to 307, no anion gap, DKA very unlikely.. Morphine and zofran given for pain and nausea. There was a long wait time for patient to get CT scan because the department is busy, went ahead and gave dose of IV clindamycin for dental infection while patient waited.  CT scan shows dental and periodontal disease. Probable lateral cortical erosion of the mandible of tooth 29 responsible for the right face inflammation. Findings are consistent with phlegmonous  inflammation without drainable abscess.  Reassessment patient reports pain has significantly improved.  She is able to talk comfortably and open her mouth wider than before.  Discussed CT scan findings with  patient.  She feels comfortable going home and following up with a dentist.  Resources given.  She has her insulin and will take her home dose of insulin and monitor sugars closely at home.  Patient is tolerating p.o. intake.  I did prescribe patient short course of Norco for pain. I have reviewed the PDMP during this encounter. Patient is prescribed liquid hydrocodone that she finished since discharge.  Given the amount of pain she was in and the obvious discomfort I do feel is reasonable to give her a very short course of p.o. Norco.  The patient appears reasonably screened and/or stabilized for discharge and I doubt any other medical condition or other [facility] requiring further screening, evaluation, or treatment in the ED at this time prior to discharge. The patient is safe for discharge with strict return precautions discussed. Recommend dental  follow up.   Portions of this note were generated with Scientist, clinical (histocompatibility and immunogenetics)Dragon dictation software. Dictation errors may occur despite best attempts at proofreading.    Final Clinical Impressions(s) / ED Diagnoses   Final diagnoses:  Pain, dental    ED Discharge Orders         Ordered    naproxen (NAPROSYN) 500 MG tablet  2 times daily PRN     08/26/19 1828    HYDROcodone-acetaminophen (NORCO/VICODIN) 5-325 MG tablet  Every 6 hours PRN     08/26/19 1828    chlorhexidine (PERIDEX) 0.12 % solution  2 times daily     08/26/19 1828           Vy Badley, Caroleen HammanKaitlyn E, PA-C 08/26/19 1843    Melene PlanFloyd, Dan, DO 08/27/19 [phone]

## 2019-08-26 NOTE — ED Notes (Signed)
Patient transported to CT 

## 2019-08-26 NOTE — ED Notes (Signed)
Pt given ginger ale.

## 2019-08-26 NOTE — ED Notes (Signed)
Patient verbalizes understanding of discharge instructions. Opportunity for questioning and answers were provided. Armband removed by staff, pt discharged from ED to home via pov, boyfriend to pick up.

## 2019-08-26 NOTE — ED Triage Notes (Signed)
Pt presents from for R jaw dental pain, was seen 10/26 for this and told to return if worsened, has been taking amoxicillin for this as directed. Today, swelling has extended to R ear and and R side of her neck/throat. Pt is having difficulty swallow/eating and has noticed drooling at home at times.   Denies SOB, fever, chills, confusion, CP.

## 2019-08-26 NOTE — ED Triage Notes (Signed)
Pt states that she slipped and hit the R side of her face on her car mirror in the rain, suggests that this may be what "started it off" for her sx to worsen.

## 2019-08-26 NOTE — Discharge Instructions (Signed)
Today you were seen for dental pain.  If you start to experience and new or worsening symptoms return to the emergency department. If you start to experience fever, chills, neck stiffness/pain, or inability to move your neck or open your mouth come back to the emergency department immediately. If you begin to experience any blistering, rashes, swelling, or difficulty breathing seek medical care for evaluation of potentially more serious side effects.  Medications:  -Continue the antibiotic you have at home to treat the infection.  I have also prescribed naproxen which is an anti-inflammatory medicine to treat the pain.  -Swish and spit 15 mL of chlorhexidine mouthwash for 30 seconds times daily.  You can also gargle warm salt water up to 5 times daily.  Both of these rinses can help to remove bacteria from the mouth.  You can also use viscous lidocaine every 3 hours to help numb the area.  You can apply a cool compress for 15 to 20 minutes, but avoid applying heat to the area. -Continue usual home medications.  2. Follow Up:  -Included is the information for the on-call dentist Dr. Haig Prophet.  Please call his office tomorrow to try to schedule a follow-up appointment. Use the resource guide listed below to help you find a dentist if you do not already have one to follow up with. It is very important that you get evaluated by a dentist as soon as possible. Call tomorrow to schedule an appointment. Read the instructions below.     Be sure to eat something when taking the Naproxen as it can cause stomach upset and at worst stomach bleeding. Do not take additional non steroidal anti-inflammatory medicines such as Ibuprofen, Aleve, Advil, Mobic, Diclofenac, or goodie powder while taking Naproxen. You may supplement with Tylenol.    Dental Care: Organization         Address  Phone  Notes  [facility] Department of [facility] [phone]  Accepts children up to age 6 who are enrolled in [place] or [place]; pregnant women with a Medicaid card; and children who have applied for Medicaid or [place] Health Choice, but were declined, whose parents can pay a reduced fee at time of service.  [facility] Department of [facility]  [address], [place] [phone] Accepts children up to age 38 who are enrolled in [place] or [place]; pregnant women with a Medicaid card; and children who have applied for Medicaid or [place] Health Choice, but were declined, whose parents can pay a reduced fee at time of service.  [place] [facility]  [place] [phone] Patients are seen by appointment only. Walk-ins are not accepted. [place] will see patients 34 years of age and older. Monday - Tuesday (8am-5pm) Most Wednesdays (8:30-5pm) $30 per visit, cash only  [facility] Adult Dental Access PROGRAM  [address], [facility] [phone] Patients are seen by appointment only. Walk-ins are not accepted. [place] will see patients 70 years of age and older. One Wednesday Evening (Monthly: Volunteer Based).  $30 per visit, cash only  [place]  [phone] for adults; Children under age 50, call Graduate Pediatric Dentistry at [phone]. Children aged 51-14, please call [phone] to request a pediatric application.  Dental services are provided in all areas of dental care including fillings, crowns and bridges, complete and partial  dentures, implants, gum treatment, root canals, and extractions. Preventive care is also provided. Treatment is provided to both adults and children. Patients are selected via a lottery and there is often a waiting list.   [facility] [address], [place]  [phone] [email or web address]   [facility] [address] [place], [place] [phone], Ext. [phone] Second  and Fourth Thursday of each month, opens at 6:30 AM; Clinic ends at 9 AM.  Patients are seen on a first-come first-served basis, and a limited number are seen during each clinic.   [facility]  [address] [place], [place] [phone]   Eligibility Requirements You must have lived in [place], [place], or [place] counties for at least the last three months.   You cannot be eligible for state or federal sponsored [place], including [employer], [place], or [employer].   You generally cannot be eligible for healthcare insurance through your employer.    How to apply: Eligibility screenings are held every Tuesday and Wednesday afternoon from 1:00 pm until 4:00 pm. You do not need an appointment for the interview!  [facility] [address], [place], [place] [phone]   [facility] Health Department  [phone]   [facility] Health Department  [phone]   [facility] Health Department  [phone]    RESOURCE GUIDE:  Dental Problems  Patients with Medicaid: [facility] [phone] [address]                                [phone] [address] Phone:  [phone]                                                  Phone:  [phone]  If unable to pay or uninsured, contact:  [facility] or [facility]. to become qualified for the adult dental clinic.  Insufficient Money for Medicine Contact [employer]:  call "211" or [facility] [phone].  No Primary Care Doctor Call [facility]  [phone] Other agencies that provide inexpensive medical care    [facility]  [phone]    [facility]  [phone]    [facility]  [phone]    [facility]  [phone]    [facility]  [phone]    [facility]  [phone]

## 2020-01-27 ENCOUNTER — Emergency Department (HOSPITAL_COMMUNITY)
Admission: EM | Admit: 2020-01-27 | Discharge: 2020-01-27 | Disposition: A | Discharge status: Home / Self Care | Payer: BC Managed Care – PPO | Attending: Emergency Medicine | Admitting: Emergency Medicine

## 2020-01-27 ENCOUNTER — Encounter (HOSPITAL_COMMUNITY): Payer: Self-pay | Admitting: Emergency Medicine

## 2020-01-27 ENCOUNTER — Emergency Department (HOSPITAL_COMMUNITY): Payer: BC Managed Care – PPO

## 2020-01-27 ENCOUNTER — Other Ambulatory Visit: Payer: Self-pay

## 2020-01-27 DIAGNOSIS — R05 Cough: Secondary | ICD-10-CM | POA: Diagnosis present

## 2020-01-27 DIAGNOSIS — Z20822 Contact with and (suspected) exposure to covid-19: Secondary | ICD-10-CM | POA: Insufficient documentation

## 2020-01-27 DIAGNOSIS — E119 Type 2 diabetes mellitus without complications: Secondary | ICD-10-CM | POA: Insufficient documentation

## 2020-01-27 DIAGNOSIS — R070 Pain in throat: Secondary | ICD-10-CM | POA: Insufficient documentation

## 2020-01-27 DIAGNOSIS — Z794 Long term (current) use of insulin: Secondary | ICD-10-CM | POA: Diagnosis not present

## 2020-01-27 DIAGNOSIS — J069 Acute upper respiratory infection, unspecified: Secondary | ICD-10-CM | POA: Diagnosis not present

## 2020-01-27 DIAGNOSIS — Z79899 Other long term (current) drug therapy: Secondary | ICD-10-CM | POA: Insufficient documentation

## 2020-01-27 DIAGNOSIS — R509 Fever, unspecified: Secondary | ICD-10-CM | POA: Insufficient documentation

## 2020-01-27 DIAGNOSIS — Z87891 Personal history of nicotine dependence: Secondary | ICD-10-CM | POA: Diagnosis not present

## 2020-01-27 LAB — CBC WITH DIFFERENTIAL/PLATELET
Abs Immature Granulocytes: 0.02 10*3/uL (ref 0.00–0.07)
Basophils Absolute: 0.1 10*3/uL (ref 0.0–0.1)
Basophils Relative: 1 %
Eosinophils Absolute: 0.2 10*3/uL (ref 0.0–0.5)
Eosinophils Relative: 3 %
HCT: 45.3 % (ref 36.0–46.0)
Hemoglobin: 15.1 g/dL — ABNORMAL HIGH (ref 12.0–15.0)
Immature Granulocytes: 0 %
Lymphocytes Relative: 28 %
Lymphs Abs: 1.8 10*3/uL (ref 0.7–4.0)
MCH: 30.3 pg (ref 26.0–34.0)
MCHC: 33.3 g/dL (ref 30.0–36.0)
MCV: 91 fL (ref 80.0–100.0)
Monocytes Absolute: 0.4 10*3/uL (ref 0.1–1.0)
Monocytes Relative: 5 %
Neutro Abs: 4.2 10*3/uL (ref 1.7–7.7)
Neutrophils Relative %: 63 %
Platelets: 251 10*3/uL (ref 150–400)
RBC: 4.98 MIL/uL (ref 3.87–5.11)
RDW: 12.9 % (ref 11.5–15.5)
WBC: 6.7 10*3/uL (ref 4.0–10.5)
nRBC: 0 % (ref 0.0–0.2)

## 2020-01-27 LAB — COMPREHENSIVE METABOLIC PANEL
ALT: 19 U/L (ref 0–44)
AST: 15 U/L (ref 15–41)
Albumin: 3.3 g/dL — ABNORMAL LOW (ref 3.5–5.0)
Alkaline Phosphatase: 62 U/L (ref 38–126)
Anion gap: 10 (ref 5–15)
BUN: 9 mg/dL (ref 6–20)
CO2: 21 mmol/L — ABNORMAL LOW (ref 22–32)
Calcium: 8.8 mg/dL — ABNORMAL LOW (ref 8.9–10.3)
Chloride: 104 mmol/L (ref 98–111)
Creatinine, Ser: 0.46 mg/dL (ref 0.44–1.00)
GFR calc Af Amer: >60 mL/min (ref >60)
GFR calc non Af Amer: >60 mL/min (ref >60)
Glucose, Bld: 306 mg/dL — ABNORMAL HIGH (ref 70–99)
Potassium: 4.2 mmol/L (ref 3.5–5.1)
Sodium: 135 mmol/L (ref 135–145)
Total Bilirubin: 0.6 mg/dL (ref 0.3–1.2)
Total Protein: 6.4 g/dL — ABNORMAL LOW (ref 6.5–8.1)

## 2020-01-27 LAB — I-STAT BETA HCG BLOOD, ED (MC, WL, AP ONLY): I-stat hCG, quantitative: <5 m[IU]/mL (ref <5)

## 2020-01-27 LAB — POC SARS CORONAVIRUS 2 AG -  ED: SARS Coronavirus 2 Ag: NEGATIVE

## 2020-01-27 LAB — GROUP A STREP BY PCR: Group A Strep by PCR: NOT DETECTED

## 2020-01-27 LAB — SARS CORONAVIRUS 2 (TAT 6-24 HRS): SARS Coronavirus 2: NEGATIVE

## 2020-01-27 MED ORDER — LIDOCAINE VISCOUS HCL 2 % MT SOLN
15.0000 mL | Freq: Once | OROMUCOSAL | Status: AC
  Administered 2020-01-27: 15 mL via OROMUCOSAL
  Filled 2020-01-27: qty 15

## 2020-01-27 MED ORDER — LIDOCAINE VISCOUS HCL 2 % MT SOLN: 15.0000 mL | OROMUCOSAL | 0 refills | Status: DC | PRN

## 2020-01-27 MED ORDER — SODIUM CHLORIDE 0.9 % IV BOLUS
1000.0000 mL | Freq: Once | INTRAVENOUS | Status: AC
  Administered 2020-01-27: 1000 mL via INTRAVENOUS

## 2020-01-27 NOTE — ED Provider Notes (Signed)
[facility] EMERGENCY DEPARTMENT Provider Note   CSN: [id number] Arrival date & time: 01/27/20  1008     History Chief Complaint  Patient presents with  . Cough  . Sore Throat  . Fever    Stephanie Oconnor is a 39 y.o. female with past medical history significant for type 2 diabetes on insulin presents to emergency department today with chief complaints of fever, body aches, sore throat, cough, nasal congestion x3 days.  She reports T-max of 101 day of symptom onset.  She has been taking Tylenol, last dose was last night.  She states her cough is nonproductive and she feels short of breath when coughing.  She describes her sore throat as burning and raw.  She has decreased p.o. intake secondary to nausea accompanying her symptoms.  She does also endorse loss of sense of smell.  She denies any sick contacts.  Denies any chest pain, abdominal pain, gross hematuria, urinary frequency, dysuria, diarrhea.  Denies any contact with anyone known positive for COVID-19.  History provided by patient with additional history obtained from chart review.     Past Medical History:  Diagnosis Date  . Anxiety   . Depression   . Diabetes mellitus without complication (HCC)   . Hypocholesteremia   . Migraines     There are no problems to display for this patient.   Past Surgical History:  Procedure Laterality Date  . EYE SURGERY       OB History   No obstetric history on file.     No family history on file.  Social History   Tobacco Use  . Smoking status: Former Games developer  . Smokeless tobacco: Never Used  Substance Use Topics  . Alcohol use: Yes  . Drug use: Yes    Types: Marijuana    Home Medications Prior to Admission medications   Medication Sig Start Date End Date Taking? Authorizing Provider  chlorhexidine (PERIDEX) 0.12 % solution Use as directed 15 mLs in the mouth or throat 2 (two) times daily. 08/26/19   Finn Amos E, PA-C  HYDROcodone-acetaminophen  (NORCO/VICODIN) 5-325 MG tablet Take 1 tablet by mouth every 6 (six) hours as needed for severe pain. 08/26/19   Zykia Walla E, PA-C  lidocaine (XYLOCAINE) 2 % solution Use as directed 15 mLs in the mouth or throat as needed for mouth pain. 01/27/20   Emilio Baylock E, PA-C  naproxen (NAPROSYN) 500 MG tablet Take 1 tablet (500 mg total) by mouth 2 (two) times daily as needed. 08/26/19   Karyna Bessler, Caroleen Hamman, PA-C    Allergies    Codeine  Review of Systems   Review of Systems  All other systems are reviewed and are negative for acute change except as noted in the HPI.   Physical Exam Updated Vital Signs BP (!) 150/91   Pulse 86   Temp 98.2 F (36.8 C) (Oral)   Resp 20   Ht 5\' 8"  (1.727 m)   Wt 99.8 kg   SpO2 97%   BMI 33.45 kg/m   Physical Exam Vitals and nursing note reviewed.  Constitutional:      General: She is not in acute distress.    Appearance: She is not ill-appearing.  HENT:     Head: Normocephalic.     Right Ear: Tympanic membrane and external ear normal.     Left Ear: Tympanic membrane and external ear normal.     Nose: Nose normal.     Mouth/Throat:  Mouth: Mucous membranes are dry.     Pharynx: Oropharynx is clear.     Comments: Minor erythema to oropharynx, no edema, no exudate, no tonsillar swelling, voice normal, neck supple without lymphadenopathy  Eyes:     General: No scleral icterus.       Right eye: No discharge.        Left eye: No discharge.     Extraocular Movements: Extraocular movements intact.     Conjunctiva/sclera: Conjunctivae normal.     Pupils: Pupils are equal, round, and reactive to light.  Neck:     Vascular: No JVD.     Comments: Full ROM intact without spinous process TTP. No bony stepoffs or deformities, no paraspinous muscle TTP or muscle spasms. No rigidity or meningeal signs. No bruising, erythema, or swelling.  Cardiovascular:     Rate and Rhythm: Normal rate and regular rhythm.     Pulses: Normal pulses.           Radial pulses are 2+ on the right side and 2+ on the left side.     Heart sounds: Normal heart sounds.  Pulmonary:     Comments: Lungs clear to auscultation in all fields. Symmetric chest rise. No wheezing, rales, or rhonchi.  Oxygen saturation is 97% on room air during exam Abdominal:     Tenderness: There is no right CVA tenderness or left CVA tenderness.     Comments: Abdomen is soft, non-distended, and non-tender in all quadrants. No rigidity, no guarding. No peritoneal signs.  Musculoskeletal:        General: Normal range of motion.     Cervical back: Normal range of motion.  Skin:    General: Skin is warm and dry.     Capillary Refill: Capillary refill takes less than 2 seconds.     Findings: No rash.  Neurological:     Mental Status: She is oriented to person, place, and time.     GCS: GCS eye subscore is 4. GCS verbal subscore is 5. GCS motor subscore is 6.     Comments: Fluent speech, no facial droop.  Psychiatric:        Behavior: Behavior normal.     ED Results / Procedures / Treatments   Labs (all labs ordered are listed, but only abnormal results are displayed) Labs Reviewed  COMPREHENSIVE METABOLIC PANEL - Abnormal; Notable for the following components:      Result Value   CO2 21 (*)    Glucose, Bld 306 (*)    Calcium 8.8 (*)    Total Protein 6.4 (*)    Albumin 3.3 (*)    All other components within normal limits  CBC WITH DIFFERENTIAL/PLATELET - Abnormal; Notable for the following components:   Hemoglobin 15.1 (*)    All other components within normal limits  GROUP A STREP BY PCR  SARS CORONAVIRUS 2 (TAT 6-24 HRS)  I-STAT BETA HCG BLOOD, ED ([facility], [facility], [facility] ONLY)  POC SARS CORONAVIRUS 2 AG -  ED    EKG None  Radiology DG Chest Portable 1 View  Result Date: 01/27/2020 CLINICAL DATA:  Cough.  Shortness of breath. EXAM: PORTABLE CHEST 1 VIEW COMPARISON:  No prior. FINDINGS: Mediastinum is normal. Left hilar fullness noted. Although this could be vascular left  hilar adenopathy cannot be excluded. Heart size normal. Low lung volumes with mild bibasilar subsegmental atelectasis. Mild elevation left hemidiaphragm. No pleural effusion or pneumothorax. IMPRESSION: 1. Left hilar fullness. Although this could be vascular, left hilar  adenopathy cannot be excluded. 2. Low lung volumes with mild bibasilar atelectasis. Mild elevation left hemidiaphragm. Electronically Signed   By: Marcello Moores  Register   On: 01/27/2020 12:30    Procedures Procedures (including critical care time)  Medications Ordered in ED Medications  sodium chloride 0.9 % bolus 1,000 mL (0 mLs Intravenous Stopped 01/27/20 1300)  lidocaine (XYLOCAINE) 2 % viscous mouth solution 15 mL (15 mLs Mouth/Throat Given 01/27/20 1346)    ED Course  I have reviewed the triage vital signs and the nursing notes.  Pertinent labs & imaging results that were available during my care of the patient were reviewed by me and considered in my medical decision making (see chart for details).    MDM Rules/Calculators/A&P                      Patient seen and examined. Patient presents awake, alert, hemodynamically stable, afebrile.  No tachycardia or hypoxia patient does not appear to be toxic but does look like she does not feel well.  On exam she looks dehydrated with dry mucous membranes.  Posterior oropharynx is erythematous without exudates seen. Presentation not concerning for PTA or Ludwig's angina, Uvulitis, epiglottitis, peritonsillar abscess, or retropharyngeal abscess. Lungs are clear to auscultation in all fields with normal work of breathing.  No abdominal pain.  She is diabetic and has not been checking her sugar while she has been sick.  Will check basic labs, get chest x-ray, test for Covid, strep and attempt symptom control.  Labs show no leukocytosis, no anemia, no severe electrolyte derangement.  She does have hyperglycemia with glucose of 306, normal anion gap.  IV fluids given.  Strep test is negative.   Covid test is negative.  Pregnancy test is negative.  I viewed pt's chest xray and it does not suggest acute infectious processes. Patient ambulated in the emergency department without respiratory distress, tachycardia, or hypoxia. SpO2 during ambulation >94% on room air.  On reassessment patient is resting comfortably.  After viscous lidocaine her throat pain has resolved.  She is tolerating p.o. intake.  Suspect viral illness as the cause of her symptoms.  Discussed symptomatic care and the importance of checking her glucose at home. The patient appears reasonably screened and/or stabilized for discharge and I doubt any other medical condition or other [facility] requiring further screening, evaluation, or treatment in the ED at this time prior to discharge. The patient is safe for discharge with strict return precautions discussed. Recommend pcp follow up if symptoms persist.  Stephanie Oconnor was evaluated in Emergency Department on 01/27/2020 for the symptoms described in the history of present illness. She was evaluated in the context of the global COVID-19 pandemic, which necessitated consideration that the patient might be at risk for infection with the SARS-CoV-2 virus that causes COVID-19. Institutional protocols and algorithms that pertain to the evaluation of patients at risk for COVID-19 are in a state of rapid change based on information released by regulatory bodies including the CDC and federal and state organizations. These policies and algorithms were followed during the patient's care in the ED.   Portions of this note were generated with [employer]. Dictation errors may occur despite best attempts at proofreading.   Final Clinical Impression(s) / ED Diagnoses Final diagnoses:  Upper respiratory tract infection, unspecified type    Rx / DC Orders ED Discharge Orders         Ordered    lidocaine (XYLOCAINE) 2 % solution  As needed     01/27/20 1429           Meelah Tallo,  Mliss Sax 01/27/20 1839    Rolan Bucco, MD 01/28/20 [phone]

## 2020-01-27 NOTE — Discharge Instructions (Signed)
You were seen in the ED for cold symptoms.  I suspect you have a virus. We tested your for COVID-19 (coronavirus) infection.  It is also possible you could have other viral upper respiratory infection from another virus.    Test results come back in 24 hours, sometimes sooner.  Someone will call you if ypu are positive for COVID. If the result is negative you can see it on your MyChart.  Treatment of your illness and symptoms will include self-isolation, monitoring of symptoms and supportive care with over-the-counter medicines.    Return to the ED if there is increased work of breathing, shortness of breath, inability to tolerate fluids, weakness, chest pain.  If your test results are POSITIVE, the following isolation requirements need to be met to return to work and resume essential activities: At least 10 days since symptom onset  72 hours of absence of fever without antifever medicine (ibuprofen, acetaminophen). A fever is temperature of 100.31F or greater. Improvement of respiratory symptoms  If your test is NEGATIVE, you may return to work and essential activities as long as your symptoms have improved and you do not have a fever for a total of 3 days.  Call your job and notify them that your test result was negative to see if they will allow you to return to work.   Stay well-hydrated. Rest. You can use over the counter medications to help with symptoms: 600 mg ibuprofen (motrin, aleve, advil) or acetaminophen (tylenol) every 6 hours, around the clock to help with associated fevers, sore throat, headaches, generalized body aches and malaise.  Oxymetazoline (afrin) intranasal spray once daily for no more than 3 days to help with congestion, after 3 days you can switch to another over-the-counter nasal steroid spray such as fluticasone (flonase) Allergy medication (loratadine, cetirizine, etc) and phenylephrine (sudafed) help with nasal congestion, runny nose and postnasal drip.     Dextromethorphan (Delsym) to suppress dry cough. Frequent coughing is likely causing your chest wall pain Wash your hands often to prevent spread.    Infection Prevention Recommendations for Individuals Confirmed to have, or Being Evaluated for, or have symptoms of 2019 Novel Coronavirus (COVID-19) Infection Who Receive Care at Home  Individuals who are confirmed to have, or are being evaluated for, COVID-19 should follow the prevention steps below until a healthcare provider or local or state health department says they can return to normal activities.  Stay home except to get medical care You should restrict activities outside your home, except for getting medical care. Do not go to work, school, or public areas, and do not use public transportation or taxis.  Call ahead before visiting your doctor Before your medical appointment, call the healthcare provider and tell them that you have, or are being evaluated for, COVID-19 infection. This will help the healthcare provider's office take steps to keep other people from getting infected. Ask your healthcare provider to call the local or state health department.  Monitor your symptoms Seek prompt medical attention if your illness is worsening (e.g., difficulty breathing). Before going to your medical appointment, call the healthcare provider and tell them that you have, or are being evaluated for, COVID-19 infection. Ask your healthcare provider to call the local or state health department.  Wear a facemask You should wear a facemask that covers your nose and mouth when you are in the same room with other people and when you visit a healthcare provider. People who live with or visit you should also wear  a facemask while they are in the same room with you.  Separate yourself from other people in your home As much as possible, you should stay in a different room from other people in your home. Also, you should use a separate bathroom, if  available.  Avoid sharing household items You should not share dishes, drinking glasses, cups, eating utensils, towels, bedding, or other items with other people in your home. After using these items, you should wash them thoroughly with soap and water.  Cover your coughs and sneezes Cover your mouth and nose with a tissue when you cough or sneeze, or you can cough or sneeze into your sleeve. Throw used tissues in a lined trash can, and immediately wash your hands with soap and water for at least 20 seconds or use an alcohol-based hand rub.  Wash your [facility] your hands often and thoroughly with soap and water for at least 20 seconds. You can use an alcohol-based hand sanitizer if soap and water are not available and if your hands are not visibly dirty. Avoid touching your eyes, nose, and mouth with unwashed hands.   Prevention Steps for Caregivers and Household Members of Individuals Confirmed to have, or Being Evaluated for, or have symptoms of 2019 Novel Coronavirus (COVID-19) Infection Being Cared for in the Home  If you live with, or provide care at home for, a person confirmed to have, or being evaluated for, COVID-19 infection please follow these guidelines to prevent infection:  Follow healthcare provider's instructions Make sure that you understand and can help the patient follow any healthcare provider instructions for all care.  Provide for the patient's basic needs You should help the patient with basic needs in the home and provide support for getting groceries, prescriptions, and other personal needs.  Monitor the patient's symptoms If they are getting sicker, call his or her medical provider and tell them that the patient has, or is being evaluated for, COVID-19 infection. This will help the healthcare provider's office take steps to keep other people from getting infected. Ask the healthcare provider to call the local or state health department.  Limit the number of  people who have contact with the patient If possible, have only one caregiver for the patient. Other household members should stay in another home or place of residence. If this is not possible, they should stay in another room, or be separated from the patient as much as possible. Use a separate bathroom, if available. Restrict visitors who do not have an essential need to be in the home.  Keep older adults, very young children, and other sick people away from the patient Keep older adults, very young children, and those who have compromised immune systems or chronic health conditions away from the patient. This includes people with chronic heart, lung, or kidney conditions, diabetes, and cancer.  Ensure good ventilation Make sure that shared spaces in the home have good air flow, such as from an air conditioner or an opened window, weather permitting.  Wash your hands often Wash your hands often and thoroughly with soap and water for at least 20 seconds. You can use an alcohol based hand sanitizer if soap and water are not available and if your hands are not visibly dirty. Avoid touching your eyes, nose, and mouth with unwashed hands. Use disposable paper towels to dry your hands. If not available, use dedicated cloth towels and replace them when they become wet.  Wear a facemask and gloves Wear a  disposable facemask at all times in the room and gloves when you touch or have contact with the patient's blood, body fluids, and/or secretions or excretions, such as sweat, saliva, sputum, nasal mucus, vomit, urine, or feces.  Ensure the mask fits over your nose and mouth tightly, and do not touch it during use. Throw out disposable facemasks and gloves after using them. Do not reuse. Wash your hands immediately after removing your facemask and gloves. If your personal clothing becomes contaminated, carefully remove clothing and launder. Wash your hands after handling contaminated clothing. Place  all used disposable facemasks, gloves, and other waste in a lined container before disposing them with other household waste. Remove gloves and wash your hands immediately after handling these items.  Do not share dishes, glasses, or other household items with the patient Avoid sharing household items. You should not share dishes, drinking glasses, cups, eating utensils, towels, bedding, or other items with a patient who is confirmed to have, or being evaluated for, COVID-19 infection. After the person uses these items, you should wash them thoroughly with soap and water.  Wash laundry thoroughly Immediately remove and wash clothes or bedding that have blood, body fluids, and/or secretions or excretions, such as sweat, saliva, sputum, nasal mucus, vomit, urine, or feces, on them. Wear gloves when handling laundry from the patient. Read and follow directions on labels of laundry or clothing items and detergent. In general, wash and dry with the warmest temperatures recommended on the label.  Clean all areas the individual has used often Clean all touchable surfaces, such as counters, tabletops, doorknobs, bathroom fixtures, toilets, phones, keyboards, tablets, and bedside tables, every day. Also, clean any surfaces that may have blood, body fluids, and/or secretions or excretions on them. Wear gloves when cleaning surfaces the patient has come in contact with. Use a diluted bleach solution (e.g., dilute bleach with 1 part bleach and 10 parts water) or a household disinfectant with a label that says EPA-registered for coronaviruses. To make a bleach solution at home, add 1 tablespoon of bleach to 1 quart (4 cups) of water. For a larger supply, add  cup of bleach to 1 gallon (16 cups) of water. Read labels of cleaning products and follow recommendations provided on product labels. Labels contain instructions for safe and effective use of the cleaning product including precautions you should take when  applying the product, such as wearing gloves or eye protection and making sure you have good ventilation during use of the product. Remove gloves and wash hands immediately after cleaning.  Monitor yourself for signs and symptoms of illness Caregivers and household members are considered close contacts, should monitor their health, and will be asked to limit movement outside of the home to the extent possible. Follow the monitoring steps for close contacts listed on the symptom monitoring form.  ? If you have additional questions, contact your local health department or call the epidemiologist on call at [phone] (available 24/7). ? This guidance is subject to change. For the most up-to-date guidance from [facility], please refer to their website: [email or web address]

## 2020-01-27 NOTE — ED Triage Notes (Addendum)
Pt reports she began having fevers, body aches, sore throat and cough x3 days. Took tylenol this morning, temp was 99 at home.

## 2020-02-22 ENCOUNTER — Other Ambulatory Visit: Payer: Self-pay

## 2020-02-22 ENCOUNTER — Emergency Department (HOSPITAL_COMMUNITY): Payer: BLUE CROSS/BLUE SHIELD

## 2020-02-22 ENCOUNTER — Encounter (HOSPITAL_COMMUNITY): Payer: Self-pay | Admitting: *Deleted

## 2020-02-22 ENCOUNTER — Emergency Department (HOSPITAL_COMMUNITY)
Admission: EM | Admit: 2020-02-22 | Discharge: 2020-02-22 | Disposition: A | Discharge status: Home / Self Care | Payer: BLUE CROSS/BLUE SHIELD | Attending: Emergency Medicine | Admitting: Emergency Medicine

## 2020-02-22 DIAGNOSIS — K047 Periapical abscess without sinus: Secondary | ICD-10-CM | POA: Insufficient documentation

## 2020-02-22 DIAGNOSIS — Z87891 Personal history of nicotine dependence: Secondary | ICD-10-CM | POA: Insufficient documentation

## 2020-02-22 LAB — BASIC METABOLIC PANEL
Anion gap: 10 (ref 5–15)
BUN: 7 mg/dL (ref 6–20)
CO2: 21 mmol/L — ABNORMAL LOW (ref 22–32)
Calcium: 8.8 mg/dL — ABNORMAL LOW (ref 8.9–10.3)
Chloride: 106 mmol/L (ref 98–111)
Creatinine, Ser: 0.48 mg/dL (ref 0.44–1.00)
GFR calc Af Amer: >60 mL/min (ref >60)
GFR calc non Af Amer: >60 mL/min (ref >60)
Glucose, Bld: 233 mg/dL — ABNORMAL HIGH (ref 70–99)
Potassium: 3.9 mmol/L (ref 3.5–5.1)
Sodium: 137 mmol/L (ref 135–145)

## 2020-02-22 LAB — CBC
HCT: 46.1 % — ABNORMAL HIGH (ref 36.0–46.0)
Hemoglobin: 15.1 g/dL — ABNORMAL HIGH (ref 12.0–15.0)
MCH: 29.4 pg (ref 26.0–34.0)
MCHC: 32.8 g/dL (ref 30.0–36.0)
MCV: 89.9 fL (ref 80.0–100.0)
Platelets: 248 10*3/uL (ref 150–400)
RBC: 5.13 MIL/uL — ABNORMAL HIGH (ref 3.87–5.11)
RDW: 13 % (ref 11.5–15.5)
WBC: 8.8 10*3/uL (ref 4.0–10.5)
nRBC: 0 % (ref 0.0–0.2)

## 2020-02-22 LAB — I-STAT BETA HCG BLOOD, ED (MC, WL, AP ONLY): I-stat hCG, quantitative: <5 m[IU]/mL (ref <5)

## 2020-02-22 MED ORDER — LIDOCAINE HCL URETHRAL/MUCOSAL 2 % EX GEL: 1.0000 "application " | Freq: Once | CUTANEOUS | Status: DC

## 2020-02-22 MED ORDER — FENTANYL CITRATE (PF) 100 MCG/2ML IJ SOLN
50.0000 ug | Freq: Once | INTRAMUSCULAR | Status: AC
  Administered 2020-02-22: 50 ug via INTRAVENOUS
  Filled 2020-02-22: qty 2

## 2020-02-22 MED ORDER — FENTANYL CITRATE (PF) 100 MCG/2ML IJ SOLN
50.0000 ug | Freq: Once | INTRAMUSCULAR | Status: AC
  Administered 2020-02-22: 12:00:00 50 ug via INTRAVENOUS
  Filled 2020-02-22: qty 2

## 2020-02-22 MED ORDER — HYDROCODONE-ACETAMINOPHEN 5-325 MG PO TABS: 1.0000 | ORAL_TABLET | ORAL | 0 refills | Status: AC | PRN

## 2020-02-22 MED ORDER — IOHEXOL 300 MG/ML  SOLN
80.0000 mL | Freq: Once | INTRAMUSCULAR | Status: AC | PRN
  Administered 2020-02-22: 80 mL via INTRAVENOUS

## 2020-02-22 MED ORDER — AMOXICILLIN-POT CLAVULANATE 875-125 MG PO TABS: 1.0000 | ORAL_TABLET | Freq: Two times a day (BID) | ORAL | 0 refills | Status: AC

## 2020-02-22 NOTE — Discharge Instructions (Addendum)
You were evaluated in the Emergency Department and after careful evaluation, we did not find any emergent condition requiring admission or further testing in the hospital.  Your exam/testing today is overall reassuring.  Your symptoms were due to an abscess next to one of your teeth that was infected.  We drained the abscess here in the emergency department.  It is important you take the antibiotics as directed.  We recommend Tylenol and Motrin for pain.  For pain keeping you from sleeping, you can use the Norco tablet provided.  Please follow-up with a dentist.  Please return to the Emergency Department if you experience any worsening of your condition.  We encourage you to follow up with a primary care provider.  Thank you for allowing [place] to be a part of your care.

## 2020-02-22 NOTE — ED Triage Notes (Signed)
Pt reports right side facial swelling and pain x 2 days. Denies dental pain. Airway intact.

## 2020-02-22 NOTE — ED Notes (Signed)
Dr Bero at bedside.  

## 2020-02-22 NOTE — ED Provider Notes (Signed)
..  Incision and Drainage  Date/Time: 02/22/2020 11:03 PM Performed by: Arthor Captain, PA-C Authorized by: Arthor Captain, PA-C   Consent:    Consent obtained:  Verbal   Consent given by:  Patient   Risks discussed:  Bleeding, incomplete drainage, pain and infection   Alternatives discussed:  Alternative treatment, no treatment, delayed treatment, observation and referral Location:    Type:  Abscess   Size:  3 cm   Location:  Mouth   Mouth location: Periapical. Anesthesia (see MAR for exact dosages):    Anesthesia method:  Topical application and local infiltration   Topical anesthetic:  Benzocaine gel   Local anesthetic:  Bupivacaine 0.5% WITH epi Procedure details:    Incision types:  Stab incision   Incision depth:  Dermal   Scalpel blade:  11   Wound management:  Irrigated with saline   Drainage amount:  Copious   Wound treatment:  Wound left open   Packing materials:  None Post-procedure details:    Patient tolerance of procedure:  Tolerated well, no immediate complications      Arthor Captain, PA-C 02/22/20 2305    Little, Ambrose Finland, MD 02/23/20 [phone]

## 2020-02-22 NOTE — ED Provider Notes (Signed)
[facility] DEPT [facility] Emergency Department Provider Note MRN:  [id number]  Arrival date & time: 02/22/20     Chief Complaint   Facial Swelling   History of Present Illness   Stephanie Oconnor is a 39 y.o. year-old female with a history of diabetes presenting to the ED with chief complaint of facial swelling.  2 days of worsening pain and swelling to the right side of the face.  No fever, no trouble breathing, no pain anywhere else.  No significant pain to the teeth.  Pain is severe, constant, unable to sleep.  Review of Systems  A complete 10 system review of systems was obtained and all systems are negative except as noted in the HPI and PMH.   Patient's Health History    Past Medical History:  Diagnosis Date  . Anxiety   . Depression   . Diabetes mellitus without complication (HCC)   . Hypocholesteremia   . Migraines     Past Surgical History:  Procedure Laterality Date  . EYE SURGERY      History reviewed. No pertinent family history.  Social History   Socioeconomic History  . Marital status: Legally Separated    Spouse name: Not on file  . Number of children: Not on file  . Years of education: Not on file  . Highest education level: Not on file  Occupational History  . Not on file  Tobacco Use  . Smoking status: Former Games developer  . Smokeless tobacco: Never Used  Substance and Sexual Activity  . Alcohol use: Yes  . Drug use: Yes    Types: Marijuana  . Sexual activity: Not on file  Other Topics Concern  . Not on file  Social History Narrative  . Not on file   Social Determinants of Health   Financial Resource Strain:   . Difficulty of Paying Living Expenses:   Food Insecurity:   . Worried About Programme researcher, broadcasting/film/video in the Last Year:   . Barista in the Last Year:   Transportation Needs:   . Freight forwarder (Medical):   Marland Kitchen Lack of Transportation (Non-Medical):   Physical Activity:   . Days of Exercise per Week:   . Minutes of  Exercise per Session:   Stress:   . Feeling of Stress :   Social Connections:   . Frequency of Communication with Friends and Family:   . Frequency of Social Gatherings with Friends and Family:   . Attends Religious Services:   . Active Member of Clubs or Organizations:   . Attends Banker Meetings:   Marland Kitchen Marital Status:   Intimate Partner Violence:   . Fear of Current or Ex-Partner:   . Emotionally Abused:   Marland Kitchen Physically Abused:   . Sexually Abused:      Physical Exam   Vitals:   02/22/20 1100 02/22/20 1509  BP: (!) 143/81 (!) 146/85  Pulse: 85 86  Resp: 18 20  Temp:    SpO2: 100% 99%    CONSTITUTIONAL: Well-appearing, NAD NEURO:  Alert and oriented x 3, no focal deficits EYES:  eyes equal and reactive ENT/NECK:  no LAD, no JVD; swelling to the right side of the face and jaw with significant tenderness to palpation to the right mandible CARDIO: Regular rate, well-perfused, normal S1 and S2 PULM:  CTAB no wheezing or rhonchi GI/GU:  normal bowel sounds, non-distended, non-tender MSK/SPINE:  No gross deformities, no edema SKIN:  no rash, atraumatic PSYCH:  Appropriate  speech and behavior  *Additional and/or pertinent findings included in MDM below  Diagnostic and Interventional Summary    EKG Interpretation  Date/Time:    Ventricular Rate:    PR Interval:    QRS Duration:   QT Interval:    QTC Calculation:   R Axis:     Text Interpretation:        Labs Reviewed  CBC - Abnormal; Notable for the following components:      Result Value   RBC 5.13 (*)    Hemoglobin 15.1 (*)    HCT 46.1 (*)    All other components within normal limits  BASIC METABOLIC PANEL - Abnormal; Notable for the following components:   CO2 21 (*)    Glucose, Bld 233 (*)    Calcium 8.8 (*)    All other components within normal limits  I-STAT BETA HCG BLOOD, ED ([facility], [facility], [facility] ONLY)    CT Maxillofacial W Contrast  Final Result      Medications  fentaNYL (SUBLIMAZE)  injection 50 mcg (50 mcg Intravenous Given 02/22/20 1153)  iohexol (OMNIPAQUE) 300 MG/ML solution 80 mL (80 mLs Intravenous Contrast Given 02/22/20 1404)  fentaNYL (SUBLIMAZE) injection 50 mcg (50 mcg Intravenous Given 02/22/20 1503)     Procedures  /  Critical Care Procedures  ED Course and Medical Decision Making  I have reviewed the triage vital signs, the nursing notes, and pertinent available records from the EMR.  Listed above are laboratory and imaging tests that I personally ordered, reviewed, and interpreted and then considered in my medical decision making (see below for details).      Trismus, significant tenderness to the right mandible with swelling, concern for odontogenic infection.  In general there is poor dentition but no obvious intraoral abscess.  Will need CT evaluation.  CT reveals large odontogenic abscess, drained by PA Arthor Captain at bedside.  Please see separate note for details.  Patient is now feeling better, still sore, no systemic symptoms or signs of more significant contiguous infection.  Appropriate for antibiotics, outpatient management with dentistry follow-up.  Elmer Sow. Pilar Plate, MD [facility] Emergency Medicine [facility] [email or web address]  Final Clinical Impressions(s) / ED Diagnoses     ICD-10-CM   1. Dental abscess  K04.7     ED Discharge Orders         Ordered    amoxicillin-clavulanate (AUGMENTIN) 875-125 MG tablet  Every 12 hours     02/22/20 1614    HYDROcodone-acetaminophen (NORCO/VICODIN) 5-325 MG tablet  Every 4 hours PRN     02/22/20 1614           Discharge Instructions Discussed with and Provided to Patient:     Discharge Instructions     You were evaluated in the Emergency Department and after careful evaluation, we did not find any emergent condition requiring admission or further testing in the hospital.  Your exam/testing today is overall reassuring.  Your symptoms were due to an abscess next to  one of your teeth that was infected.  We drained the abscess here in the emergency department.  It is important you take the antibiotics as directed.  We recommend Tylenol and Motrin for pain.  For pain keeping you from sleeping, you can use the Norco tablet provided.  Please follow-up with a dentist.  Please return to the Emergency Department if you experience any worsening of your condition.  We encourage you to follow up with a primary care provider.  Thank  you for allowing [place] to be a part of your care.       Maudie Flakes, MD 02/22/20 [phone]

## 2020-10-05 IMAGING — DX DG CHEST 1V PORT
1 series · 1 of 1 positions shown · non-contrast
Comparison: No prior.

CLINICAL DATA: Cough.  Shortness of breath.

EXAM:
PORTABLE CHEST 1 VIEW

[chest ap]
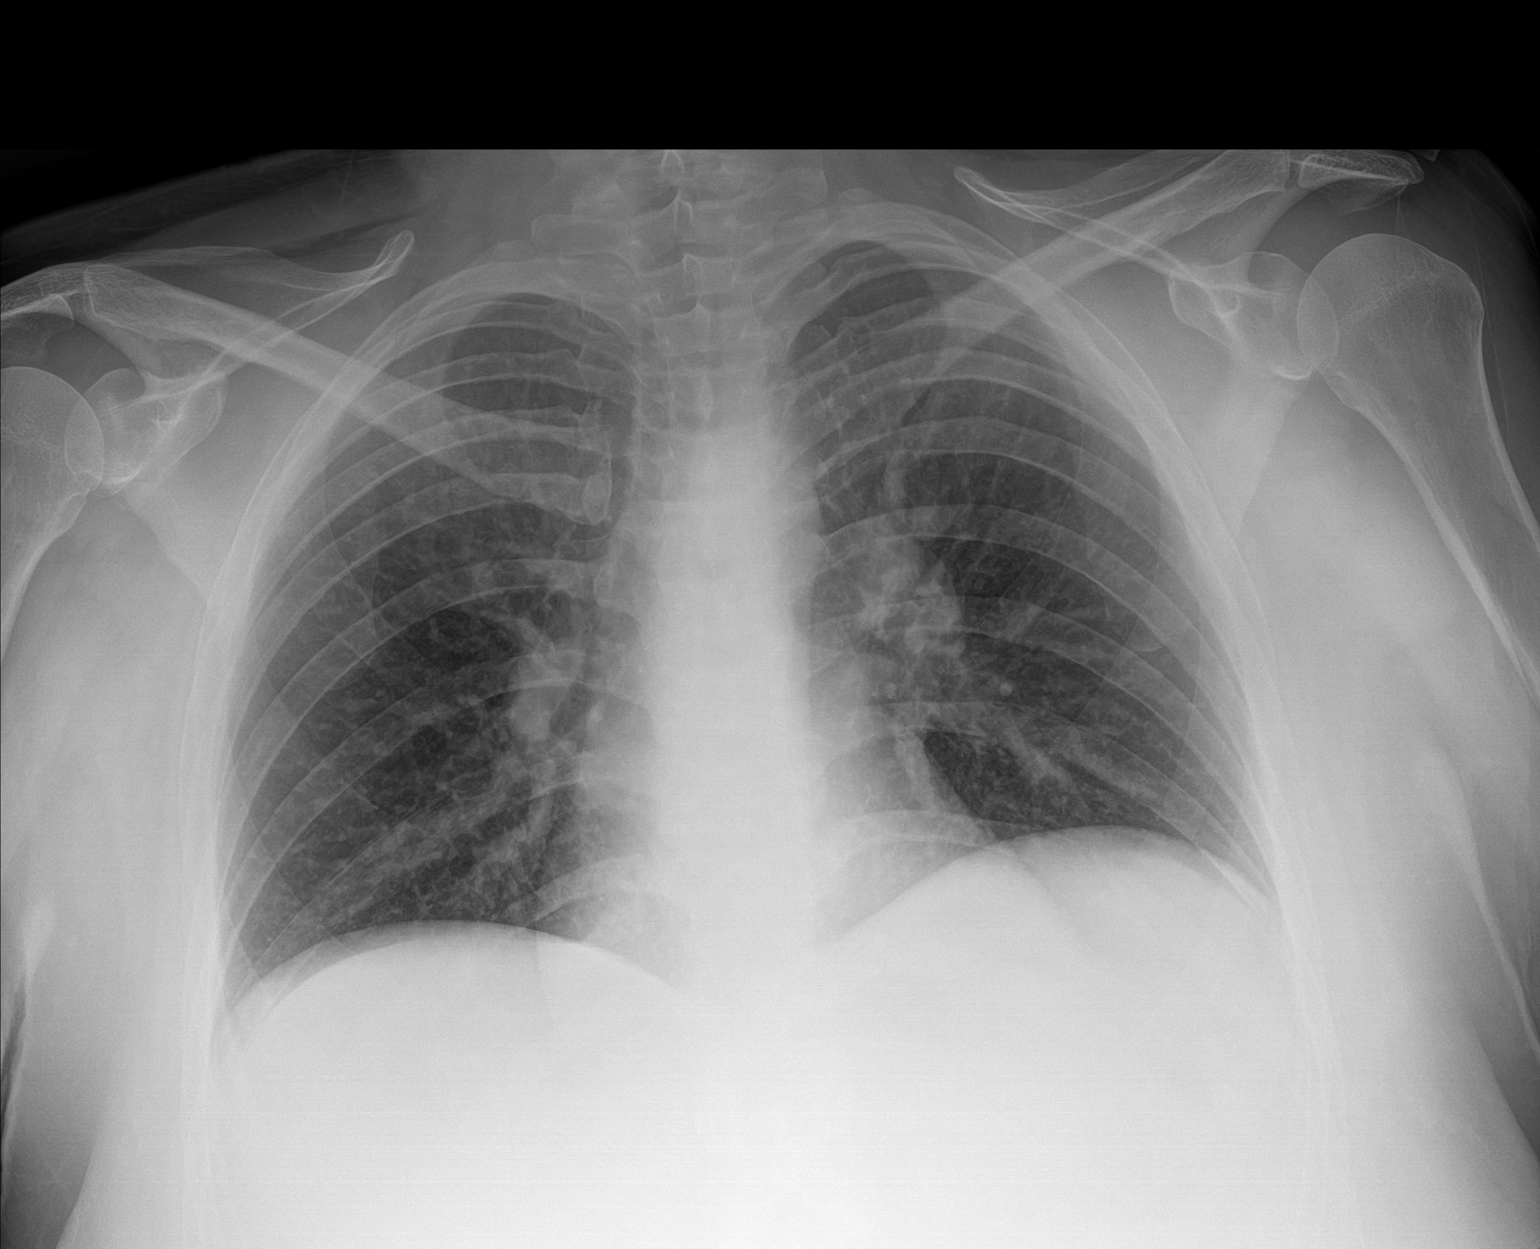

[1 of 1 positions shown; findings below may reference images not displayed]

FINDINGS: Mediastinum is normal. Left hilar fullness noted. Although this
could be vascular left hilar adenopathy cannot be excluded. Heart
size normal. Low lung volumes with mild bibasilar subsegmental
atelectasis. Mild elevation left hemidiaphragm. No pleural effusion
or pneumothorax.
IMPRESSION: 1. Left hilar fullness. Although this could be vascular, left hilar
adenopathy cannot be excluded.

2. Low lung volumes with mild bibasilar atelectasis. Mild elevation
left hemidiaphragm.
